# Patient Record
Sex: Male | Born: 2000 | Hispanic: No | Marital: Single | Smoking: Never smoker
Health system: Southern US, Community
[De-identification: ages and names within clinical notes are randomized; demographics above are authoritative.]

## PROBLEM LIST (undated history)

## (undated) HISTORY — PX: OTHER SURGICAL HISTORY: SHX169

---

## 2021-02-07 DIAGNOSIS — Z419 Encounter for procedure for purposes other than remedying health state, unspecified: Secondary | ICD-10-CM | POA: Diagnosis not present

## 2021-02-11 ENCOUNTER — Ambulatory Visit (HOSPITAL_COMMUNITY)
Admission: EM | Admit: 2021-02-11 | Discharge: 2021-02-11 | Disposition: A | Discharge status: Home / Self Care | Payer: Medicaid Other | Attending: Student | Admitting: Student

## 2021-02-11 ENCOUNTER — Encounter (HOSPITAL_COMMUNITY): Payer: Self-pay | Admitting: Emergency Medicine

## 2021-02-11 ENCOUNTER — Other Ambulatory Visit: Payer: Self-pay

## 2021-02-11 DIAGNOSIS — S0081XA Abrasion of other part of head, initial encounter: Secondary | ICD-10-CM | POA: Diagnosis not present

## 2021-02-11 DIAGNOSIS — Z23 Encounter for immunization: Secondary | ICD-10-CM | POA: Diagnosis not present

## 2021-02-11 MED ORDER — TETANUS-DIPHTH-ACELL PERTUSSIS 5-2.5-18.5 LF-MCG/0.5 IM SUSY
PREFILLED_SYRINGE | INTRAMUSCULAR | Status: AC
  Filled 2021-02-11: qty 0.5

## 2021-02-11 MED ORDER — TETANUS-DIPHTH-ACELL PERTUSSIS 5-2.5-18.5 LF-MCG/0.5 IM SUSY
0.5000 mL | PREFILLED_SYRINGE | Freq: Once | INTRAMUSCULAR | Status: AC
  Administered 2021-02-11: 0.5 mL via INTRAMUSCULAR
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
## 2021-02-11 NOTE — Discharge Instructions (Addendum)
-  Keep your face wound dry for 4 days. After that, wash with gentle soap and water 2x daily. -Wash your wound with gentle soap and water 1-2 times daily.  Let air dry or gently pat. Follow with over-the-counter antibiotic ointment and a bandaid to keep clean. -Seek additional immediate medical attention if new symptoms like redness or swelling surrounding the wounds, discharge from the wounds, increasing jaw pain, pain with chewing, new headaches, vision changes, dizziness.

## 2021-02-14 ENCOUNTER — Other Ambulatory Visit: Payer: Self-pay

## 2021-02-14 ENCOUNTER — Ambulatory Visit (HOSPITAL_COMMUNITY)
Admission: EM | Admit: 2021-02-14 | Discharge: 2021-02-14 | Disposition: A | Discharge status: Home / Self Care | Payer: Medicaid Other

## 2021-02-14 ENCOUNTER — Encounter (HOSPITAL_COMMUNITY): Payer: Self-pay | Admitting: Emergency Medicine

## 2021-02-14 DIAGNOSIS — S0181XD Laceration without foreign body of other part of head, subsequent encounter: Secondary | ICD-10-CM

## 2021-02-14 DIAGNOSIS — S0181XA Laceration without foreign body of other part of head, initial encounter: Secondary | ICD-10-CM
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
## 2021-02-14 NOTE — Discharge Instructions (Addendum)
Soak area on your chin 20 minutes twice a day to help glue come off.  Continue antibiotic ointment to wounds

## 2021-02-15 NOTE — ED Provider Notes (Signed)
[facility]    CSN: [id number] Arrival date & time: 02/14/21  1546      History   Chief Complaint Chief Complaint  Patient presents with   Follow-up   Wound Check    HPI Kerry Hoffman is a 20 y.o. male.   The history is provided by the patient. No language interpreter was used.  Wound Check The current episode started more than 2 days ago. The problem occurs constantly. The problem has not changed since onset.Nothing aggravates the symptoms.   History reviewed. No pertinent past medical history.  There are no problems to display for this patient.   History reviewed. No pertinent surgical history.  Pt here for recheck of abrasions.  Pt reports areas seem to be healing well.     Home Medications    Prior to Admission medications   Not on File    Family History Family History  Problem Relation Age of Onset   Healthy Mother    Healthy Father     Social History Social History   Tobacco Use   Smoking status: Never   Smokeless tobacco: Never  Vaping Use   Vaping Use: Never used  Substance Use Topics   Alcohol use: Never   Drug use: Never     Allergies   Patient has no known allergies.   Review of Systems Review of Systems  Skin:  Positive for wound.  All other systems reviewed and are negative.   Physical Exam Triage Vital Signs ED Triage Vitals [02/14/21 1625]  Enc Vitals Group     BP 130/84     Pulse Rate (!) 52     Resp 16     Temp 98.1 F (36.7 C)     Temp Source Oral     SpO2 100 %     Weight      Height      Head Circumference      Peak Flow      Pain Score 4     Pain Loc      Pain Edu?      Excl. in GC?    No data found.  Updated Vital Signs BP 130/84 (BP Location: Right Arm)   Pulse (!) 52   Temp 98.1 F (36.7 C) (Oral)   Resp 16   SpO2 100%   Visual Acuity Right Eye Distance:   Left Eye Distance:   Bilateral Distance:    Right Eye Near:   Left Eye Near:    Bilateral Near:     Physical  Exam Vitals reviewed.  Cardiovascular:     Rate and Rhythm: Normal rate.  Pulmonary:     Effort: Pulmonary effort is normal.  Musculoskeletal:        General: Normal range of motion.  Skin:    Findings: Erythema present.     Comments: Abrasions arms and legs  scabbed,  no sign of infection  laceration chin glue in place  no sign of infection   Neurological:     General: No focal deficit present.     Mental Status: He is alert.  Psychiatric:        Mood and Affect: Mood normal.     UC Treatments / Results  Labs (all labs ordered are listed, but only abnormal results are displayed) Labs Reviewed - No data to display  EKG   Radiology No results found.  Procedures Procedures (including critical care time)  Medications Ordered in UC Medications - No data  to display  Initial Impression / Assessment and Plan / UC Course  I have reviewed the triage vital signs and the nursing notes.  Pertinent labs & imaging results that were available during my care of the patient were reviewed by me and considered in my medical decision making (see chart for details).     Final Clinical Impressions(s) / UC Diagnoses   Final diagnoses:  Facial laceration, subsequent encounter     Discharge Instructions      Soak area on your chin 20 minutes twice a day to help glue come off.  Continue antibiotic ointment to wounds    ED Prescriptions   None    PDMP not reviewed this encounter.   Elson Areas, [place] 02/15/21 1645

## 2021-03-10 DIAGNOSIS — Z419 Encounter for procedure for purposes other than remedying health state, unspecified: Secondary | ICD-10-CM | POA: Diagnosis not present

## 2021-04-01 ENCOUNTER — Ambulatory Visit (INDEPENDENT_AMBULATORY_CARE_PROVIDER_SITE_OTHER): Payer: Medicaid Other | Admitting: Family Medicine

## 2021-04-01 ENCOUNTER — Other Ambulatory Visit: Payer: Self-pay

## 2021-04-01 ENCOUNTER — Encounter: Payer: Self-pay | Admitting: Family Medicine

## 2021-04-01 VITALS — BP 124/80 | HR 63 | Temp 97.5°F | Resp 16 | Ht 71.5 in | Wt 158.0 lb

## 2021-04-01 DIAGNOSIS — Z1159 Encounter for screening for other viral diseases: Secondary | ICD-10-CM

## 2021-04-01 DIAGNOSIS — Z0000 Encounter for general adult medical examination without abnormal findings: Secondary | ICD-10-CM

## 2021-04-01 NOTE — Progress Notes (Signed)
New Patient Office Visit  Subjective:  Patient ID: Kerry Hoffman, male    DOB: Feb 20, 2001  Age: 20 y.o. MRN: [id number]  CC:  Chief Complaint  Patient presents with   Establish Care   Eye Problem    HPI Kerry Hoffman presents for to establish care and for a physical exam. OV was aided by an interpretor.   No past medical history on file.  No past surgical history on file.  Family History  Problem Relation Age of Onset   Healthy Mother    Healthy Father     Social History   Socioeconomic History   Marital status: Single    Spouse name: Not on file   Number of children: Not on file   Years of education: Not on file   Highest education level: Not on file  Occupational History   Not on file  Tobacco Use   Smoking status: Never   Smokeless tobacco: Never  Vaping Use   Vaping Use: Never used  Substance and Sexual Activity   Alcohol use: Never   Drug use: Never   Sexual activity: Not on file  Other Topics Concern   Not on file  Social History Narrative   Not on file   Social Determinants of Health   Financial Resource Strain: Not on file  Food Insecurity: Not on file  Transportation Needs: Not on file  Physical Activity: Not on file  Stress: Not on file  Social Connections: Not on file  Intimate Partner Violence: Not on file    ROS Review of Systems  All other systems reviewed and are negative.  Objective:   Today's Vitals: BP 124/80   Pulse 63   Temp (!) 97.5 F (36.4 C)   Resp 16   Ht 5' 11.5" (1.816 m)   Wt 158 lb (71.7 kg)   SpO2 98%   BMI 21.73 kg/m   Physical Exam Vitals and nursing note reviewed.  Constitutional:      General: He is not in acute distress. HENT:     Head: Normocephalic and atraumatic.     Right Ear: Tympanic membrane, ear canal and external ear normal.     Left Ear: Tympanic membrane, ear canal and external ear normal.     Mouth/Throat:     Mouth: Mucous membranes are moist.     Pharynx: Oropharynx  is clear.  Eyes:     Conjunctiva/sclera: Conjunctivae normal.     Pupils: Pupils are equal, round, and reactive to light.  Cardiovascular:     Rate and Rhythm: Normal rate and regular rhythm.     Pulses: Normal pulses.     Heart sounds: Normal heart sounds.  Pulmonary:     Effort: Pulmonary effort is normal. No respiratory distress.     Breath sounds: Normal breath sounds.  Abdominal:     General: There is no distension.     Palpations: Abdomen is soft.     Tenderness: There is no abdominal tenderness.  Genitourinary:    Comments: Patient deferred Musculoskeletal:     Right lower leg: No edema.     Left lower leg: No edema.  Skin:    General: Skin is warm and dry.  Neurological:     General: No focal deficit present.     Mental Status: He is alert and oriented to person, place, and time. Mental status is at baseline.  Psychiatric:        Mood and Affect: Mood normal.  Behavior: Behavior normal.    Assessment & Plan:   1. Annual physical exam Unremarkable exam - routine labs ordered - CMP14+EGFR - CBC with Differential  2. Need for hepatitis C screening test  - Hepatitis C Antibody     Follow-up: Return in about 1 year (around 04/01/2022) for physical.   Becky Sax, MD

## 2021-04-01 NOTE — Progress Notes (Signed)
Physical

## 2021-04-02 LAB — CMP14+EGFR
ALT: 14 IU/L (ref 0–44)
AST: 24 IU/L (ref 0–40)
Albumin/Globulin Ratio: 1.8 (ref 1.2–2.2)
Albumin: 4.6 g/dL (ref 4.1–5.2)
Alkaline Phosphatase: 116 IU/L (ref 51–125)
BUN/Creatinine Ratio: 15 (ref 9–20)
BUN: 14 mg/dL (ref 6–20)
Bilirubin Total: 0.6 mg/dL (ref 0.0–1.2)
CO2: 22 mmol/L (ref 20–29)
Calcium: 9.6 mg/dL (ref 8.7–10.2)
Chloride: 101 mmol/L (ref 96–106)
Creatinine, Ser: 0.93 mg/dL (ref 0.76–1.27)
Globulin, Total: 2.6 g/dL (ref 1.5–4.5)
Glucose: 92 mg/dL (ref 65–99)
Potassium: 3.6 mmol/L (ref 3.5–5.2)
Sodium: 141 mmol/L (ref 134–144)
Total Protein: 7.2 g/dL (ref 6.0–8.5)
eGFR: 121 mL/min/{1.73_m2} (ref >59)

## 2021-04-02 LAB — CBC WITH DIFFERENTIAL/PLATELET
Basophils Absolute: 0 10*3/uL (ref 0.0–0.2)
Basos: 0 %
EOS (ABSOLUTE): 0.1 10*3/uL (ref 0.0–0.4)
Eos: 2 %
Hematocrit: 40.4 % (ref 37.5–51.0)
Hemoglobin: 14.2 g/dL (ref 13.0–17.7)
Immature Grans (Abs): 0 10*3/uL (ref 0.0–0.1)
Immature Granulocytes: 0 %
Lymphocytes Absolute: 2.4 10*3/uL (ref 0.7–3.1)
Lymphs: 41 %
MCH: 31.8 pg (ref 26.6–33.0)
MCHC: 35.1 g/dL (ref 31.5–35.7)
MCV: 90 fL (ref 79–97)
Monocytes Absolute: 0.6 10*3/uL (ref 0.1–0.9)
Monocytes: 10 %
Neutrophils Absolute: 2.8 10*3/uL (ref 1.4–7.0)
Neutrophils: 47 %
Platelets: 189 10*3/uL (ref 150–450)
RBC: 4.47 x10E6/uL (ref 4.14–5.80)
RDW: 12.5 % (ref 11.6–15.4)
WBC: 5.9 10*3/uL (ref 3.4–10.8)

## 2021-04-02 LAB — HEPATITIS C ANTIBODY: Hep C Virus Ab: <0.1 s/co ratio (ref 0.0–0.9)

## 2021-04-10 DIAGNOSIS — Z419 Encounter for procedure for purposes other than remedying health state, unspecified: Secondary | ICD-10-CM | POA: Diagnosis not present

## 2021-04-11 DIAGNOSIS — H5213 Myopia, bilateral: Secondary | ICD-10-CM | POA: Diagnosis not present

## 2021-04-21 ENCOUNTER — Telehealth: Payer: Self-pay | Admitting: Family Medicine

## 2021-04-21 NOTE — Telephone Encounter (Signed)
Patients mom came in the office asking to call the patient about the lab results from his 04/01/2021 appointment. Patient  has never received a call with the results.

## 2021-04-22 NOTE — Telephone Encounter (Signed)
Patient called and there was no answer or VM 

## 2021-04-24 ENCOUNTER — Other Ambulatory Visit: Payer: Self-pay | Admitting: *Deleted

## 2021-05-05 NOTE — Telephone Encounter (Signed)
Call place to patient regarding lab results and there was no answer on phone

## 2021-05-05 NOTE — Telephone Encounter (Signed)
Patient would like lab results from appointment, asking if the nurse could please call again.

## 2021-05-10 DIAGNOSIS — Z419 Encounter for procedure for purposes other than remedying health state, unspecified: Secondary | ICD-10-CM | POA: Diagnosis not present

## 2021-06-10 DIAGNOSIS — Z419 Encounter for procedure for purposes other than remedying health state, unspecified: Secondary | ICD-10-CM | POA: Diagnosis not present

## 2021-07-10 DIAGNOSIS — Z419 Encounter for procedure for purposes other than remedying health state, unspecified: Secondary | ICD-10-CM | POA: Diagnosis not present

## 2021-08-10 DIAGNOSIS — Z419 Encounter for procedure for purposes other than remedying health state, unspecified: Secondary | ICD-10-CM | POA: Diagnosis not present

## 2021-09-10 DIAGNOSIS — Z419 Encounter for procedure for purposes other than remedying health state, unspecified: Secondary | ICD-10-CM | POA: Diagnosis not present

## 2021-10-05 ENCOUNTER — Other Ambulatory Visit: Payer: Self-pay

## 2021-10-05 ENCOUNTER — Encounter (HOSPITAL_COMMUNITY): Payer: Self-pay

## 2021-10-05 ENCOUNTER — Ambulatory Visit (INDEPENDENT_AMBULATORY_CARE_PROVIDER_SITE_OTHER): Payer: Medicaid Other

## 2021-10-05 ENCOUNTER — Ambulatory Visit (HOSPITAL_COMMUNITY)
Admission: EM | Admit: 2021-10-05 | Discharge: 2021-10-05 | Disposition: A | Discharge status: Home / Self Care | Payer: Medicaid Other | Attending: Physician Assistant | Admitting: Physician Assistant

## 2021-10-05 DIAGNOSIS — M533 Sacrococcygeal disorders, not elsewhere classified: Secondary | ICD-10-CM

## 2021-10-05 MED ORDER — NAPROXEN 500 MG PO TABS: 500.0000 mg | ORAL_TABLET | Freq: Two times a day (BID) | ORAL | 0 refills | Status: DC

## 2021-10-05 NOTE — ED Provider Notes (Signed)
[facility]    CSN: [id number] Arrival date & time: 10/05/21  1638      History   Chief Complaint Chief Complaint  Patient presents with   Tailbone Pain    HPI Kerry Hoffman is a 21 y.o. male.   Patient presents today with a 3-day history of recurrence lower back pain.  Reports that he was wrestling when he developed pain and pain has been ongoing since that time.  He denies any fall or additional injury.  He does have a history of recurrent symptoms over the past several years but generally is only as per day at a time and resolve without intervention.  He reports pain is currently rated 6 on a 0-10 pain scale, localized to lower back/sacrum, described as aching, worse with changing positions, no relieving factors identified.  He has not tried any over-the-counter medication for symptom management.  Denies any previous spinal surgery.  Denies history of malignancy.  He denies any additional symptoms including rectal pain, urinary frequency, urinary urgency, fever, nausea, vomiting, fatigue, malaise.  He denies any lower extremity weakness, saddle anesthesia, bowel/bladder incontinence.   History reviewed. No pertinent past medical history.  There are no problems to display for this patient.   History reviewed. No pertinent surgical history.     Home Medications    Prior to Admission medications   Medication Sig Start Date End Date Taking? Authorizing Provider  naproxen (NAPROSYN) 500 MG tablet Take 1 tablet (500 mg total) by mouth 2 (two) times daily. 10/05/21  Yes Abdulkareem Badolato, Noberto Retort, PA-C    Family History Family History  Problem Relation Age of Onset   Healthy Mother    Healthy Father     Social History Social History   Tobacco Use   Smoking status: Never   Smokeless tobacco: Never  Vaping Use   Vaping Use: Never used  Substance Use Topics   Alcohol use: Never   Drug use: Never     Allergies   Patient has no known allergies.   Review of  Systems Review of Systems  Constitutional:  Positive for activity change. Negative for appetite change, fatigue and fever.  Respiratory:  Negative for cough and shortness of breath.   Cardiovascular:  Negative for chest pain.  Gastrointestinal:  Negative for abdominal pain, diarrhea, nausea and vomiting.  Musculoskeletal:  Positive for back pain. Negative for arthralgias and myalgias.  Neurological:  Negative for dizziness, weakness, light-headedness, numbness and headaches.    Physical Exam Triage Vital Signs ED Triage Vitals  Enc Vitals Group     BP 10/05/21 1734 131/89     Pulse Rate 10/05/21 1734 78     Resp 10/05/21 1732 16     Temp 10/05/21 1734 99.3 F (37.4 C)     Temp Source 10/05/21 1734 Oral     SpO2 10/05/21 1732 100 %     Weight --      Height --      Head Circumference --      Peak Flow --      Pain Score 10/05/21 1732 10     Pain Loc --      Pain Edu? --      Excl. in GC? --    No data found.  Updated Vital Signs BP 131/89 (BP Location: Left Arm)    Pulse 78    Temp 99.3 F (37.4 C) (Oral)    Resp 16    SpO2 100%   Visual Acuity  Right Eye Distance:   Left Eye Distance:   Bilateral Distance:    Right Eye Near:   Left Eye Near:    Bilateral Near:     Physical Exam Vitals reviewed.  Constitutional:      General: He is awake.     Appearance: Normal appearance. He is well-developed. He is not ill-appearing.     Comments: Very pleasant male appears stated age no acute distress sitting comfortably in exam room  HENT:     Head: Normocephalic and atraumatic.     Mouth/Throat:     Pharynx: No oropharyngeal exudate, posterior oropharyngeal erythema or uvula swelling.  Cardiovascular:     Rate and Rhythm: Normal rate and regular rhythm.     Heart sounds: Normal heart sounds, S1 normal and S2 normal. No murmur heard. Pulmonary:     Effort: Pulmonary effort is normal.     Breath sounds: Normal breath sounds. No stridor. No wheezing, rhonchi or rales.      Comments: Clear to auscultation bilaterally Abdominal:     General: Bowel sounds are normal.     Palpations: Abdomen is soft.     Tenderness: There is no abdominal tenderness.     Comments: Benign abdominal exam  Musculoskeletal:     Cervical back: No tenderness or bony tenderness.     Thoracic back: No tenderness or bony tenderness.     Lumbar back: Bony tenderness present. No tenderness. Negative right straight leg raise test and negative left straight leg raise test.     Comments: Tenderness palpation over lower lumbar vertebrae and sacrum.  Tenderness palpation of bilateral lower lumbar paraspinal muscles.  No deformity or step-off noted.  Strength 5/5 bilateral lower extremities.  Neurological:     Mental Status: He is alert.  Psychiatric:        Behavior: Behavior is cooperative.     UC Treatments / Results  Labs (all labs ordered are listed, but only abnormal results are displayed) Labs Reviewed - No data to display  EKG   Radiology DG Sacrum/Coccyx  Result Date: 10/05/2021 CLINICAL DATA:  Tailbone pain over the last week. EXAM: SACRUM AND COCCYX - 2+ VIEW COMPARISON:  None. FINDINGS: Normal radiography. No traumatic or other abnormal finding. Sacroiliac joints appear normal. IMPRESSION: Normal radiographs. Electronically Signed   By: Paulina Fusi M.D.   On: 10/05/2021 18:26    Procedures Procedures (including critical care time)  Medications Ordered in UC Medications - No data to display  Initial Impression / Assessment and Plan / UC Course  I have reviewed the triage vital signs and the nursing notes.  Pertinent labs & imaging results that were available during my care of the patient were reviewed by me and considered in my medical decision making (see chart for details).     Patient is well-appearing, nontoxic, afebrile, nontachycardic.  Suspect exacerbation of chronic pain related to increased physical activity.  X-ray was obtained given bony tenderness which  showed no osseous abnormality.  Patient was started on Naprosyn twice daily with instruction not to take additional NSAIDs with this medication due to risk of GI bleeding.  Encouraged him to use heat, rest, stretch for symptom relief.  Discussed that he should avoid sitting or standing 1 position as this can exacerbate pain.  Discussed that if symptoms or not improving he should follow-up with the sports medicine provider and was given contact information for local provider with instruction to call to schedule appointment if needed.  Strict return precautions  given to which patient expressed understanding.  Work excuse note provided.  Final Clinical Impressions(s) / UC Diagnoses   Final diagnoses:  Coccyxdynia     Discharge Instructions      Your x-ray was normal.  Please take Naprosyn twice daily to help with pain and inflammation.  You should not take NSAIDs with this medication including aspirin, ibuprofen/Advil, naproxen/Aleve as it can cause stomach bleeding.  You can use Tylenol for breakthrough pain.  Try to avoid prolonged sitting or standing in 1 position as this can worsen symptoms.  Use heat, rest, gentle stretch for symptom relief.  If anything worsens you have severe pain or develops systemic symptoms such as fever, nausea, vomiting, urinary symptoms you need to be seen immediately.  If symptoms or not improving please follow-up with sports medicine for further evaluation and management; call to schedule an appointment.     ED Prescriptions     Medication Sig Dispense Auth. Provider   naproxen (NAPROSYN) 500 MG tablet Take 1 tablet (500 mg total) by mouth 2 (two) times daily. 30 tablet Michale Weikel, Noberto Retort, PA-C      PDMP not reviewed this encounter.   Jeani Hawking, PA-C 10/05/21 1911

## 2021-10-05 NOTE — ED Triage Notes (Signed)
Pt presents to the office for tailbone pain x 1 week. He reports recurring back pain.

## 2021-10-05 NOTE — Discharge Instructions (Signed)
Your x-ray was normal.  Please take Naprosyn twice daily to help with pain and inflammation.  You should not take NSAIDs with this medication including aspirin, ibuprofen/Advil, naproxen/Aleve as it can cause stomach bleeding.  You can use Tylenol for breakthrough pain.  Try to avoid prolonged sitting or standing in 1 position as this can worsen symptoms.  Use heat, rest, gentle stretch for symptom relief.  If anything worsens you have severe pain or develops systemic symptoms such as fever, nausea, vomiting, urinary symptoms you need to be seen immediately.  If symptoms or not improving please follow-up with sports medicine for further evaluation and management; call to schedule an appointment.

## 2021-10-06 ENCOUNTER — Telehealth: Payer: Self-pay | Admitting: *Deleted

## 2021-10-06 NOTE — Telephone Encounter (Signed)
This CN placed call to check on client.  Client was seen in urgent care yesterday.  Client had previously asked this CN to obtain a doctor appointment for him.  However, client now says he will wait and see how the medication does for his back pain and then will let me know if a follow up appointment is needed.  Roderic Palau, RN, MSN, CNP [phone] Office [phone] Cell

## 2021-10-08 DIAGNOSIS — Z419 Encounter for procedure for purposes other than remedying health state, unspecified: Secondary | ICD-10-CM | POA: Diagnosis not present

## 2021-11-08 DIAGNOSIS — Z419 Encounter for procedure for purposes other than remedying health state, unspecified: Secondary | ICD-10-CM | POA: Diagnosis not present

## 2021-12-01 ENCOUNTER — Ambulatory Visit (INDEPENDENT_AMBULATORY_CARE_PROVIDER_SITE_OTHER): Payer: Medicaid Other | Admitting: Physician Assistant

## 2021-12-01 ENCOUNTER — Encounter: Payer: Self-pay | Admitting: Family Medicine

## 2021-12-01 ENCOUNTER — Encounter: Payer: Self-pay | Admitting: Physician Assistant

## 2021-12-01 VITALS — BP 113/64 | HR 56 | Temp 98.5°F | Resp 18 | Ht 71.0 in | Wt 159.0 lb

## 2021-12-01 DIAGNOSIS — M545 Low back pain, unspecified: Secondary | ICD-10-CM

## 2021-12-01 MED ORDER — NAPROXEN 500 MG PO TABS: 500.0000 mg | ORAL_TABLET | Freq: Two times a day (BID) | ORAL | 0 refills | Status: DC

## 2021-12-01 NOTE — Patient Instructions (Signed)
You can use the naproxen twice a day to help with your occasional back pain.  I encourage you to make sure you are staying very well-hydrated, you should drink at least 64 ounces of water a day, more on the days that you exercise. ? ?Please let us know if there is any else we can do for you ? ?Roney Jaffe, PA-C ?Physician Assistant ?[facility] ?[email or web address] ? ? ?Acute Back Pain, Adult ?Acute back pain is sudden and usually short-lived. It is often caused by an injury to the muscles and tissues in the back. The injury may result from: ?A muscle, tendon, or ligament getting overstretched or torn. Ligaments are tissues that connect bones to each other. Lifting something improperly can cause a back strain. ?Wear and tear (degeneration) of the spinal disks. Spinal disks are circular tissue that provide cushioning between the bones of the spine (vertebrae). ?Twisting motions, such as while playing sports or doing yard work. ?A hit to the back. ?Arthritis. ?You may have a physical exam, lab tests, and imaging tests to find the cause of your pain. Acute back pain usually goes away with rest and home care. ?Follow these instructions at home: ?Managing pain, stiffness, and swelling ?Take over-the-counter and prescription medicines only as told by your health care provider. Treatment may include medicines for pain and inflammation that are taken by mouth or applied to the skin, or muscle relaxants. ?Your health care provider may recommend applying ice during the first 24-48 hours after your pain starts. To do this: ?Put ice in a plastic bag. ?Place a towel between your skin and the bag. ?Leave the ice on for 20 minutes, 2-3 times a day. ?Remove the ice if your skin turns bright red. This is very important. If you cannot feel pain, heat, or cold, you have a greater risk of damage to the area. ?If directed, apply heat to the affected area as often as told by your  health care provider. Use the heat source that your health care provider recommends, such as a moist heat pack or a heating pad. ?Place a towel between your skin and the heat source. ?Leave the heat on for 20-30 minutes. ?Remove the heat if your skin turns bright red. This is especially important if you are unable to feel pain, heat, or cold. You have a greater risk of getting burned. ?Activity ? ?Do not stay in bed. Staying in bed for more than 1-2 days can delay your recovery. ?Sit up and stand up straight. Avoid leaning forward when you sit or hunching over when you stand. ?If you work at a desk, sit close to it so you do not need to lean over. Keep your chin tucked in. Keep your neck drawn back, and keep your elbows bent at a 90-degree angle (right angle). ?Sit high and close to the steering wheel when you drive. Add lower back (lumbar) support to your car seat, if needed. ?Take short walks on even surfaces as soon as you are able. Try to increase the length of time you walk each day. ?Do not sit, drive, or stand in one place for more than 30 minutes at a time. Sitting or standing for long periods of time can put stress on your back. ?Do not drive or use heavy machinery while taking prescription pain medicine. ?Use proper lifting techniques. When you bend and lift, use positions that put less stress on your back: ?[place] your knees. ?Keep the load close to your body. ?  Avoid twisting. ?Exercise regularly as told by your health care provider. Exercising helps your back heal faster and helps prevent back injuries by keeping muscles strong and flexible. ?Work with a physical therapist to make a safe exercise program, as recommended by your health care provider. Do any exercises as told by your physical therapist. ?Lifestyle ?Maintain a healthy weight. Extra weight puts stress on your back and makes it difficult to have good posture. ?Avoid activities or situations that make you feel anxious or stressed. Stress and  anxiety increase muscle tension and can make back pain worse. Learn ways to manage anxiety and stress, such as through exercise. ?General instructions ?Sleep on a firm mattress in a comfortable position. Try lying on your side with your knees slightly bent. If you lie on your back, put a pillow under your knees. ?Keep your head and neck in a straight line with your spine (neutral position) when using electronic equipment like smartphones or pads. To do this: ?Raise your smartphone or pad to look at it instead of bending your head or neck to look down. ?Put the smartphone or pad at the level of your face while looking at the screen. ?Follow your treatment plan as told by your health care provider. This may include: ?Cognitive or behavioral therapy. ?Acupuncture or massage therapy. ?Meditation or yoga. ?Contact a health care provider if: ?You have pain that is not relieved with rest or medicine. ?You have increasing pain going down into your legs or buttocks. ?Your pain does not improve after 2 weeks. ?You have pain at night. ?You lose weight without trying. ?You have a fever or chills. ?You develop nausea or vomiting. ?You develop abdominal pain. ?Get help right away if: ?You develop new bowel or bladder control problems. ?You have unusual weakness or numbness in your arms or legs. ?You feel faint. ?These symptoms may represent a serious problem that is an emergency. Do not wait to see if the symptoms will go away. Get medical help right away. Call your local emergency services (911 in the [place] Do not drive yourself to the hospital. ?Summary ?Acute back pain is sudden and usually short-lived. ?Use proper lifting techniques. When you bend and lift, use positions that put less stress on your back. ?Take over-the-counter and prescription medicines only as told by your health care provider, and apply heat or ice as told. ?This information is not intended to replace advice given to you by your health care provider. Make  sure you discuss any questions you have with your health care provider. ?Document Revised: 10/18/2020 Document Reviewed: 10/18/2020 ?Elsevier Patient Education ? 2023 Elsevier Inc. ? ?

## 2021-12-01 NOTE — Progress Notes (Signed)
Patient reports intermittent back pain over the past two weeks and reports he may have slept wrong. ? ?

## 2021-12-01 NOTE — Progress Notes (Signed)
? ?Established Patient Office Visit ? ?Subjective   ?Patient ID: Kerry Hoffman, male    DOB: 2001-08-03  Age: 21 y.o. MRN: [id number] ? ?Chief Complaint  ?Patient presents with  ? Headache  ? ? ?States that he does not get headaches very often, states that they occur a couple of times every 6 months, states that they are easily alleviated. ? ?Does request a refill of his naproxen.  States that he will occasionally have back pain, believes it is due to over exercising, denies any injury or trauma.  States when this pain occurs that he uses the naproxen with relief. ? ?States that sleep is good, enjoys daily exercise, water intake is good, feels he eats a healthy diet. ? ?No other concerns at this time.   ? ? ?History reviewed. No pertinent past medical history. ?Social History  ? ?Socioeconomic History  ? Marital status: Single  ?  Spouse name: Not on file  ? Number of children: Not on file  ? Years of education: Not on file  ? Highest education level: Not on file  ?Occupational History  ? Not on file  ?Tobacco Use  ? Smoking status: Never  ? Smokeless tobacco: Never  ?Vaping Use  ? Vaping Use: Never used  ?Substance and Sexual Activity  ? Alcohol use: Never  ? Drug use: Never  ? Sexual activity: Not on file  ?Other Topics Concern  ? Not on file  ?Social History Narrative  ? Not on file  ? ?Social Determinants of Health  ? ?Financial Resource Strain: Not on file  ?Food Insecurity: Not on file  ?Transportation Needs: Not on file  ?Physical Activity: Not on file  ?Stress: Not on file  ?Social Connections: Not on file  ?Intimate Partner Violence: Not on file  ? ?Family History  ?Problem Relation Age of Onset  ? Healthy Mother   ? Healthy Father   ? ?No Known Allergies ?  ? ?Review of Systems  ?Constitutional:  Negative for chills and fever.  ?HENT: Negative.    ?Eyes: Negative.   ?Respiratory:  Negative for shortness of breath.   ?Cardiovascular:  Negative for chest pain.  ?Gastrointestinal: Negative.    ?Genitourinary: Negative.   ?Musculoskeletal: Negative.   ?Skin: Negative.   ?Neurological: Negative.   ?Endo/Heme/Allergies: Negative.   ?Psychiatric/Behavioral: Negative.    ? ?  ?Objective:  ?  ? ?BP 113/64 (BP Location: Left Arm, Patient Position: Sitting, Cuff Size: Normal)   Pulse (!) 56   Temp 98.5 ?F (36.9 ?C) (Oral)   Resp 18   Ht 5\' 11"  (1.803 m)   Wt 159 lb (72.1 kg)   SpO2 98%   BMI 22.18 kg/m?  ? ? ?Physical Exam ?Vitals and nursing note reviewed.  ?Constitutional:   ?   Appearance: Normal appearance.  ?HENT:  ?   Head: Normocephalic and atraumatic.  ?   Right Ear: External ear normal.  ?   Left Ear: External ear normal.  ?   Nose: Nose normal.  ?   Mouth/Throat:  ?   Mouth: Mucous membranes are moist.  ?   Pharynx: Oropharynx is clear.  ?Eyes:  ?   Extraocular Movements: Extraocular movements intact.  ?   Conjunctiva/sclera: Conjunctivae normal.  ?   Pupils: Pupils are equal, round, and reactive to light.  ?Cardiovascular:  ?   Rate and Rhythm: Normal rate and regular rhythm.  ?   Pulses: Normal pulses.  ?   Heart sounds: Normal heart sounds.  ?  Pulmonary:  ?   Effort: Pulmonary effort is normal.  ?   Breath sounds: Normal breath sounds.  ?Musculoskeletal:     ?   General: Normal range of motion.  ?   Cervical back: Normal range of motion and neck supple.  ?Skin: ?   General: Skin is warm and dry.  ?Neurological:  ?   General: No focal deficit present.  ?   Mental Status: He is alert and oriented to person, place, and time.  ?Psychiatric:     ?   Mood and Affect: Mood normal.     ?   Behavior: Behavior normal.     ?   Thought Content: Thought content normal.     ?   Judgment: Judgment normal.  ? ? ? ?  ?Assessment & Plan:  ? ?Problem List Items Addressed This Visit   ?None ?Visit Diagnoses   ? ? Acute bilateral low back pain without sciatica    -  Primary  ? Relevant Medications  ? naproxen (NAPROSYN) 500 MG tablet  ? ?  ? ?1. Bilateral low back pain without sciatica, unspecified  chronicity ?Continue naproxen as needed.  Patient education given on supportive care.  Red flags given for prompt reevaluation. ?- naproxen (NAPROSYN) 500 MG tablet; Take 1 tablet (500 mg total) by mouth 2 (two) times daily.  Dispense: 30 tablet; Refill: 0 ? ? ? ?I have reviewed the patient's medical history (PMH, PSH, Social History, Family History, Medications, and allergies) , and have been updated if relevant. I spent 10 minutes reviewing chart and  face to face time with patient. ? ? ?Return if symptoms worsen or fail to improve.  ? ? ?Meika Earll S Mayers, PA-C ? ?

## 2021-12-02 ENCOUNTER — Telehealth: Payer: Self-pay | Admitting: *Deleted

## 2021-12-02 NOTE — Telephone Encounter (Signed)
Copied from CRM [id number]. Topic: Referral - Request for Referral ?>> Dec 02, 2021  1:49 PM McGill, Darlina Rumpf wrote: ?Has patient seen PCP for this complaint? No. Stated recently had an appointment and forgot to mention.  ?*If NO, is insurance requiring patient see PCP for this issue before PCP can refer them? N/A ?Referral for which specialty: Ophthalmologist  ?Preferred provider/office: N/A  ?Reason for referral: Genie Logue RN from [employer] stated pt forgot to mention headaches when wearing glasses. ?

## 2021-12-08 DIAGNOSIS — Z419 Encounter for procedure for purposes other than remedying health state, unspecified: Secondary | ICD-10-CM | POA: Diagnosis not present

## 2022-01-08 DIAGNOSIS — Z419 Encounter for procedure for purposes other than remedying health state, unspecified: Secondary | ICD-10-CM | POA: Diagnosis not present

## 2022-01-22 ENCOUNTER — Encounter (HOSPITAL_COMMUNITY): Payer: Self-pay | Admitting: Emergency Medicine

## 2022-01-22 ENCOUNTER — Ambulatory Visit (HOSPITAL_COMMUNITY)
Admission: EM | Admit: 2022-01-22 | Discharge: 2022-01-22 | Disposition: A | Discharge status: Home / Self Care | Payer: Medicaid Other | Attending: Internal Medicine | Admitting: Internal Medicine

## 2022-01-22 DIAGNOSIS — J029 Acute pharyngitis, unspecified: Secondary | ICD-10-CM | POA: Diagnosis not present

## 2022-01-22 DIAGNOSIS — Z20822 Contact with and (suspected) exposure to covid-19: Secondary | ICD-10-CM | POA: Insufficient documentation

## 2022-01-22 MED ORDER — KETOROLAC TROMETHAMINE 30 MG/ML IJ SOLN
INTRAMUSCULAR | Status: AC
  Filled 2022-01-22: qty 1

## 2022-01-22 MED ORDER — KETOROLAC TROMETHAMINE 30 MG/ML IJ SOLN
30.0000 mg | Freq: Once | INTRAMUSCULAR | Status: AC
  Administered 2022-01-22: 30 mg via INTRAMUSCULAR

## 2022-01-22 MED ORDER — IBUPROFEN 800 MG PO TABS: 800.0000 mg | ORAL_TABLET | Freq: Three times a day (TID) | ORAL | 0 refills | Status: DC | PRN

## 2022-01-22 NOTE — ED Triage Notes (Signed)
Pt reports headache and body aches x 3 days. Reports pain goes away for short time with ibuprofen but then comes back

## 2022-01-22 NOTE — Discharge Instructions (Addendum)
Please increase oral fluid intake Take ibuprofen as needed for pain We will call you with recommendations if labs are abnormal Please quarantine until COVID-19 test results are available Return to urgent care if you have any other concerns.

## 2022-01-22 NOTE — ED Provider Notes (Signed)
[facility]    CSN: [id number] Arrival date & time: 01/22/22  1314      History   Chief Complaint Chief Complaint  Patient presents with   Headache   Generalized Body Aches    HPI Kerry Hoffman is a 21 y.o. male comes to the urgent care with a 3-day history of sore throat, generalized body aches, subjective fever, chills and left-sided headache.  Symptoms started insidiously and has been persistent.  Patient denies any light sensitivity.  No aggravation of headache by noise or bright light.  No nausea or vomiting.  No history of migraines.  Patient denies any sick contacts.  He is vaccinated against COVID-19 virus.  He contracted COVID-19 infection about 3 years ago.  Patient works as a Electrical engineer.  Patient endorses pain with swallowing and loss of appetite. HPI  History reviewed. No pertinent past medical history.  There are no problems to display for this patient.   History reviewed. No pertinent surgical history.     Home Medications    Prior to Admission medications   Medication Sig Start Date End Date Taking? Authorizing Provider  ibuprofen (ADVIL) 800 MG tablet Take 1 tablet (800 mg total) by mouth every 8 (eight) hours as needed. 01/22/22  Yes Lin Hackmann, Britta Mccreedy, MD    Family History Family History  Problem Relation Age of Onset   Healthy Mother    Healthy Father     Social History Social History   Tobacco Use   Smoking status: Never   Smokeless tobacco: Never  Vaping Use   Vaping Use: Never used  Substance Use Topics   Alcohol use: Never   Drug use: Never     Allergies   Patient has no known allergies.   Review of Systems Review of Systems  Constitutional:  Positive for chills, fatigue and fever. Negative for unexpected weight change.  HENT:  Positive for sore throat. Negative for congestion, ear discharge, ear pain, facial swelling, trouble swallowing and voice change.   Respiratory:  Negative for cough, shortness of  breath and wheezing.   Cardiovascular: Negative.   Gastrointestinal: Negative.      Physical Exam Triage Vital Signs ED Triage Vitals  Enc Vitals Group     BP 01/22/22 1405 (!) 131/96     Pulse Rate 01/22/22 1405 60     Resp --      Temp 01/22/22 1405 99.4 F (37.4 C)     Temp Source 01/22/22 1405 Oral     SpO2 01/22/22 1405 97 %     Weight --      Height --      Head Circumference --      Peak Flow --      Pain Score 01/22/22 1404 6     Pain Loc --      Pain Edu? --      Excl. in GC? --    No data found.  Updated Vital Signs BP (!) 131/96 (BP Location: Right Arm)   Pulse 60   Temp 99.4 F (37.4 C) (Oral)   SpO2 97%   Visual Acuity Right Eye Distance:   Left Eye Distance:   Bilateral Distance:    Right Eye Near:   Left Eye Near:    Bilateral Near:     Physical Exam Vitals and nursing note reviewed.  Constitutional:      General: He is not in acute distress.    Appearance: He is well-developed. He is not  ill-appearing.  Eyes:     Extraocular Movements: Extraocular movements intact.     Pupils: Pupils are equal, round, and reactive to light.  Cardiovascular:     Rate and Rhythm: Normal rate and regular rhythm.     Heart sounds: Normal heart sounds.  Pulmonary:     Effort: Pulmonary effort is normal.     Breath sounds: Normal breath sounds.  Abdominal:     General: Bowel sounds are normal.     Palpations: Abdomen is soft.  Neurological:     Mental Status: He is alert.     GCS: GCS eye subscore is 4. GCS verbal subscore is 5. GCS motor subscore is 6.      UC Treatments / Results  Labs (all labs ordered are listed, but only abnormal results are displayed) Labs Reviewed  SARS CORONAVIRUS 2 (TAT 6-24 HRS)    EKG   Radiology No results found.  Procedures Procedures (including critical care time)  Medications Ordered in UC Medications  ketorolac (TORADOL) 30 MG/ML injection 30 mg (has no administration in time range)    Initial  Impression / Assessment and Plan / UC Course  I have reviewed the triage vital signs and the nursing notes.  Pertinent labs & imaging results that were available during my care of the patient were reviewed by me and considered in my medical decision making (see chart for details).     1.  Acute viral pharyngitis: COVID-19 PCR test has been sent Toradol 30 mg IM x1 dose Ibuprofen 800 mg every 6-8 hours as needed for pain We will call patient with recommendations if labs are abnormal Quarantine precautions given Return precautions given Maintain adequate hydration  Final Clinical Impressions(s) / UC Diagnoses   Final diagnoses:  Acute viral pharyngitis     Discharge Instructions      Please increase oral fluid intake Take ibuprofen as needed for pain We will call you with recommendations if labs are abnormal Please quarantine until COVID-19 test results are available Return to urgent care if you have any other concerns.   ED Prescriptions     Medication Sig Dispense Auth. Provider   ibuprofen (ADVIL) 800 MG tablet Take 1 tablet (800 mg total) by mouth every 8 (eight) hours as needed. 21 tablet Deo Mehringer, Britta Mccreedy, MD      PDMP not reviewed this encounter.   Merrilee Jansky, MD 01/22/22 1432

## 2022-01-23 LAB — SARS CORONAVIRUS 2 (TAT 6-24 HRS): SARS Coronavirus 2: NEGATIVE

## 2022-02-07 DIAGNOSIS — Z419 Encounter for procedure for purposes other than remedying health state, unspecified: Secondary | ICD-10-CM | POA: Diagnosis not present

## 2022-02-08 DIAGNOSIS — M25511 Pain in right shoulder: Secondary | ICD-10-CM | POA: Diagnosis not present

## 2022-02-08 DIAGNOSIS — M549 Dorsalgia, unspecified: Secondary | ICD-10-CM | POA: Diagnosis not present

## 2022-02-08 DIAGNOSIS — S161XXA Strain of muscle, fascia and tendon at neck level, initial encounter: Secondary | ICD-10-CM | POA: Diagnosis not present

## 2022-02-08 DIAGNOSIS — M546 Pain in thoracic spine: Secondary | ICD-10-CM | POA: Diagnosis not present

## 2022-02-11 ENCOUNTER — Encounter: Payer: Self-pay | Admitting: *Deleted

## 2022-02-11 NOTE — Congregational Nurse Program (Signed)
  Dept: [phone]   Congregational Nurse Program Note  Date of Encounter: 02/11/2022  Past Medical History: No past medical history on file.  Encounter Details:  CNP Questionnaire - 02/11/22 1400       Questionnaire   Do you give verbal consent to treat you today? Yes    Location Patient [place]    Visit Setting Home;Phone/Text/Email    Patient Status Refugee    Insurance [employer] Referral N/A    Medication N/A    Medical Provider Yes    Screening Referrals N/A    Medical Referral N/A    Medical Appointment Made N/A    Food N/A    Transportation N/A    Housing/Utilities N/A    Interpersonal Safety N/A    Intervention Merchandiser, retail    ED Visit Averted Yes    Life-Saving Intervention Made N/A            Met with client in his home with his family.  Call placed to [employer] medicaid per client request.  Client has not received his new [facility] card.  Also updated PCP.  Card to be mailed with 10-14 days per [employer] insurance.  Explained all information to client.  Will follow up with client as requested.  Karene Fry, RN, MSN, [place] Office [phone] Cell

## 2022-03-10 ENCOUNTER — Telehealth: Payer: Self-pay | Admitting: Family Medicine

## 2022-03-10 DIAGNOSIS — Z419 Encounter for procedure for purposes other than remedying health state, unspecified: Secondary | ICD-10-CM | POA: Diagnosis not present

## 2022-03-10 NOTE — Telephone Encounter (Signed)
Copied from CRM [id number]. Topic: Appointment Scheduling - Scheduling Inquiry for Clinic >> Mar 10, 2022  3:10 PM [name] wrote: Genie with the Congregational Nurse Program called and said the patient was contacted and told they needed a vaccine before starting school on August 28th. Please assist in finding out which vaccine is needed and scheduling a visit to receive the vaccine.

## 2022-03-11 NOTE — Telephone Encounter (Signed)
Patient has been schedule for  nurse visit 

## 2022-03-20 ENCOUNTER — Ambulatory Visit (INDEPENDENT_AMBULATORY_CARE_PROVIDER_SITE_OTHER): Payer: Medicaid Other

## 2022-03-20 DIAGNOSIS — Z23 Encounter for immunization: Secondary | ICD-10-CM

## 2022-03-20 NOTE — Progress Notes (Signed)
Patient came in for Immunizations catch up Patient tolerated vaccine well. 

## 2022-04-10 DIAGNOSIS — Z419 Encounter for procedure for purposes other than remedying health state, unspecified: Secondary | ICD-10-CM | POA: Diagnosis not present

## 2022-04-22 ENCOUNTER — Telehealth: Payer: Self-pay | Admitting: Family Medicine

## 2022-04-22 NOTE — Telephone Encounter (Signed)
Pt called in from school w/ father Gul in the office -- states School is requesting Physical and IMMUNIZATION records for [facility] on [address] [place], [place] [address] [phone]   Pt's father was informed request is sent to Pt's nurse and due to Clinic time -they'll receive a call when this is ready for pick up.

## 2022-04-23 NOTE — Telephone Encounter (Signed)
Patient father was called to come pick up paper from office between 8-11 and 1-4 Mon-Fri

## 2022-05-10 DIAGNOSIS — Z419 Encounter for procedure for purposes other than remedying health state, unspecified: Secondary | ICD-10-CM | POA: Diagnosis not present

## 2022-05-22 ENCOUNTER — Telehealth: Payer: Self-pay | Admitting: Family Medicine

## 2022-05-22 NOTE — Telephone Encounter (Signed)
Father dropped off [employer] form for completion and asked to be notified when ready for pick up please. Placed in provider bin.  

## 2022-05-25 NOTE — Telephone Encounter (Signed)
Father was called to co me pick up paper for child. [employer] form and vaccine record  Aided by Simin [id number]  

## 2022-05-26 ENCOUNTER — Ambulatory Visit (INDEPENDENT_AMBULATORY_CARE_PROVIDER_SITE_OTHER): Payer: Medicaid Other

## 2022-05-26 ENCOUNTER — Telehealth: Payer: Self-pay | Admitting: Family Medicine

## 2022-05-26 ENCOUNTER — Encounter: Payer: Self-pay | Admitting: Family Medicine

## 2022-05-26 DIAGNOSIS — Z23 Encounter for immunization: Secondary | ICD-10-CM | POA: Diagnosis not present

## 2022-05-26 NOTE — Progress Notes (Signed)
Patient came  to get vaccine  that are need for school. Patient had no other concerns  today

## 2022-05-26 NOTE — Telephone Encounter (Signed)
Done

## 2022-05-26 NOTE — Telephone Encounter (Signed)
Pt states School mentioned "Until you get these vaccines, you cannot return to school." Pt adds, " They mark me absent everyday I don't have this."  Pt brought in [place] DEPT [employer] STATE Vaccination Doc Worksheet  Paperwork in provider bin.

## 2022-06-10 DIAGNOSIS — Z419 Encounter for procedure for purposes other than remedying health state, unspecified: Secondary | ICD-10-CM | POA: Diagnosis not present

## 2022-07-01 ENCOUNTER — Emergency Department
Admission: EM | Admit: 2022-07-01 | Discharge: 2022-07-01 | Disposition: A | Discharge status: Home / Self Care | Payer: Medicaid Other | Attending: Emergency Medicine | Admitting: Emergency Medicine

## 2022-07-01 ENCOUNTER — Other Ambulatory Visit: Payer: Self-pay

## 2022-07-01 ENCOUNTER — Emergency Department: Payer: Medicaid Other

## 2022-07-01 DIAGNOSIS — S5002XA Contusion of left elbow, initial encounter: Secondary | ICD-10-CM | POA: Diagnosis not present

## 2022-07-01 DIAGNOSIS — Y92410 Unspecified street and highway as the place of occurrence of the external cause: Secondary | ICD-10-CM | POA: Diagnosis not present

## 2022-07-01 DIAGNOSIS — S59902A Unspecified injury of left elbow, initial encounter: Secondary | ICD-10-CM | POA: Diagnosis present

## 2022-07-01 DIAGNOSIS — S5012XA Contusion of left forearm, initial encounter: Secondary | ICD-10-CM | POA: Diagnosis not present

## 2022-07-01 DIAGNOSIS — Z789 Other specified health status: Secondary | ICD-10-CM | POA: Diagnosis not present

## 2022-07-01 DIAGNOSIS — T1490XA Injury, unspecified, initial encounter: Secondary | ICD-10-CM | POA: Diagnosis not present

## 2022-07-01 DIAGNOSIS — R0689 Other abnormalities of breathing: Secondary | ICD-10-CM | POA: Diagnosis not present

## 2022-07-01 MED ORDER — IBUPROFEN 600 MG PO TABS: 600.0000 mg | ORAL_TABLET | Freq: Three times a day (TID) | ORAL | 0 refills | Status: DC | PRN

## 2022-07-01 NOTE — Discharge Instructions (Signed)
Follow-up with your primary care provider if any continued problems or concerns.  Use ice to your elbow as needed for discomfort and elevate if any swelling.  The Ace wrap was applied to provide some cushioning and protection.  A prescription for ibuprofen was sent to the pharmacy to be taken every 8 hours with food if needed for pain.  You may take Tylenol additionally if extra pain medication as needed.
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
## 2022-07-10 DIAGNOSIS — Z419 Encounter for procedure for purposes other than remedying health state, unspecified: Secondary | ICD-10-CM | POA: Diagnosis not present

## 2022-08-10 DIAGNOSIS — Z419 Encounter for procedure for purposes other than remedying health state, unspecified: Secondary | ICD-10-CM | POA: Diagnosis not present

## 2022-09-10 DIAGNOSIS — H5213 Myopia, bilateral: Secondary | ICD-10-CM | POA: Diagnosis not present

## 2022-09-10 DIAGNOSIS — Z419 Encounter for procedure for purposes other than remedying health state, unspecified: Secondary | ICD-10-CM | POA: Diagnosis not present

## 2022-10-09 DIAGNOSIS — Z419 Encounter for procedure for purposes other than remedying health state, unspecified: Secondary | ICD-10-CM | POA: Diagnosis not present

## 2022-10-18 ENCOUNTER — Ambulatory Visit (HOSPITAL_COMMUNITY)
Admission: EM | Admit: 2022-10-18 | Discharge: 2022-10-18 | Discharge status: Against Medical Advice | Payer: Medicaid Other

## 2022-10-18 ENCOUNTER — Other Ambulatory Visit (HOSPITAL_COMMUNITY): Payer: Medicaid Other

## 2022-10-18 NOTE — ED Triage Notes (Signed)
Attempted to get pt for triage no answer.

## 2022-10-18 NOTE — ED Notes (Signed)
Called 2x. No response.

## 2022-10-20 ENCOUNTER — Ambulatory Visit (HOSPITAL_COMMUNITY)
Admission: EM | Admit: 2022-10-20 | Discharge: 2022-10-20 | Disposition: A | Discharge status: Home / Self Care | Payer: Medicaid Other | Attending: Family Medicine | Admitting: Family Medicine

## 2022-10-20 ENCOUNTER — Ambulatory Visit (INDEPENDENT_AMBULATORY_CARE_PROVIDER_SITE_OTHER): Payer: Medicaid Other

## 2022-10-20 ENCOUNTER — Other Ambulatory Visit: Payer: Self-pay

## 2022-10-20 ENCOUNTER — Encounter (HOSPITAL_COMMUNITY): Payer: Self-pay

## 2022-10-20 DIAGNOSIS — M79645 Pain in left finger(s): Secondary | ICD-10-CM
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
## 2022-10-20 NOTE — Discharge Instructions (Addendum)
Advised patient of left thumb x-ray results with hardcopy provided to patient.  Advised patient may RICE affected area of left thumb for 30 minutes 3 times daily for the next 3 days.  Advised if symptoms worsen and/or unresolved please follow-up with [facility] orthopedic provider for further evaluation (contact information provided below).
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
## 2022-11-09 DIAGNOSIS — Z419 Encounter for procedure for purposes other than remedying health state, unspecified: Secondary | ICD-10-CM | POA: Diagnosis not present

## 2022-12-21 DIAGNOSIS — H52223 Regular astigmatism, bilateral: Secondary | ICD-10-CM | POA: Diagnosis not present

## 2023-02-01 ENCOUNTER — Encounter: Payer: Self-pay | Admitting: Family Medicine

## 2023-02-16 ENCOUNTER — Encounter: Payer: Self-pay | Admitting: Family Medicine

## 2023-02-16 ENCOUNTER — Ambulatory Visit: Payer: Medicaid Other | Admitting: Family Medicine

## 2023-02-16 VITALS — BP 108/70 | HR 64 | Temp 98.1°F | Resp 16 | Ht 71.0 in | Wt 171.4 lb

## 2023-02-16 DIAGNOSIS — Z1159 Encounter for screening for other viral diseases: Secondary | ICD-10-CM

## 2023-02-16 DIAGNOSIS — Z130 Encounter for screening for diseases of the blood and blood-forming organs and certain disorders involving the immune mechanism: Secondary | ICD-10-CM

## 2023-02-16 DIAGNOSIS — Z13220 Encounter for screening for lipoid disorders: Secondary | ICD-10-CM

## 2023-02-16 DIAGNOSIS — Z0000 Encounter for general adult medical examination without abnormal findings: Secondary | ICD-10-CM

## 2023-02-16 DIAGNOSIS — Z114 Encounter for screening for human immunodeficiency virus [HIV]: Secondary | ICD-10-CM

## 2023-02-17 ENCOUNTER — Encounter (HOSPITAL_COMMUNITY): Payer: Self-pay

## 2023-02-17 ENCOUNTER — Encounter: Payer: Self-pay | Admitting: Family Medicine

## 2023-02-17 LAB — LIPID PANEL
Chol/HDL Ratio: 2.6 ratio (ref 0.0–5.0)
Cholesterol, Total: 142 mg/dL (ref 100–199)
HDL: 55 mg/dL (ref >39)
LDL Chol Calc (NIH): 56 mg/dL (ref 0–99)
Triglycerides: 192 mg/dL — ABNORMAL HIGH (ref 0–149)
VLDL Cholesterol Cal: 31 mg/dL (ref 5–40)

## 2023-02-17 LAB — CMP14+EGFR
ALT: 11 IU/L (ref 0–44)
AST: 17 IU/L (ref 0–40)
Albumin: 4.5 g/dL (ref 4.3–5.2)
Alkaline Phosphatase: 100 IU/L (ref 44–121)
BUN/Creatinine Ratio: 19 (ref 9–20)
BUN: 16 mg/dL (ref 6–20)
Bilirubin Total: 0.4 mg/dL (ref 0.0–1.2)
CO2: 21 mmol/L (ref 20–29)
Calcium: 9.8 mg/dL (ref 8.7–10.2)
Chloride: 103 mmol/L (ref 96–106)
Creatinine, Ser: 0.84 mg/dL (ref 0.76–1.27)
Globulin, Total: 3 g/dL (ref 1.5–4.5)
Glucose: 102 mg/dL — ABNORMAL HIGH (ref 70–99)
Potassium: 4.2 mmol/L (ref 3.5–5.2)
Sodium: 140 mmol/L (ref 134–144)
Total Protein: 7.5 g/dL (ref 6.0–8.5)
eGFR: 127 mL/min/{1.73_m2} (ref >59)

## 2023-02-17 LAB — CBC WITH DIFFERENTIAL/PLATELET
Basophils Absolute: 0 10*3/uL (ref 0.0–0.2)
Basos: 0 %
EOS (ABSOLUTE): 0.1 10*3/uL (ref 0.0–0.4)
Eos: 2 %
Hematocrit: 44.9 % (ref 37.5–51.0)
Hemoglobin: 15.1 g/dL (ref 13.0–17.7)
Immature Grans (Abs): 0 10*3/uL (ref 0.0–0.1)
Immature Granulocytes: 0 %
Lymphocytes Absolute: 1.9 10*3/uL (ref 0.7–3.1)
Lymphs: 38 %
MCH: 30.5 pg (ref 26.6–33.0)
MCHC: 33.6 g/dL (ref 31.5–35.7)
MCV: 91 fL (ref 79–97)
Monocytes Absolute: 0.5 10*3/uL (ref 0.1–0.9)
Monocytes: 10 %
Neutrophils Absolute: 2.4 10*3/uL (ref 1.4–7.0)
Neutrophils: 50 %
Platelets: 131 10*3/uL — ABNORMAL LOW (ref 150–450)
RBC: 4.95 x10E6/uL (ref 4.14–5.80)
RDW: 12.3 % (ref 11.6–15.4)
WBC: 4.9 10*3/uL (ref 3.4–10.8)

## 2023-02-17 LAB — HIV ANTIBODY (ROUTINE TESTING W REFLEX): HIV Screen 4th Generation wRfx: NONREACTIVE

## 2023-02-17 LAB — HEPATITIS C ANTIBODY: Hep C Virus Ab: NONREACTIVE

## 2023-02-17 NOTE — Progress Notes (Signed)
Established Patient Office Visit  Subjective    Patient ID: Kerry Hoffman, male    DOB: 03-24-2001  Age: 22 y.o. MRN: [id number]  CC:  Chief Complaint  Patient presents with   Establish Care    HPI [facility] Kerry Hoffman presents for routine annual exam. Patient denies acute complaints.    Outpatient Encounter Medications as of 02/16/2023  Medication Sig   ondansetron (ZOFRAN) 4 MG tablet Take 4 mg by mouth daily as needed.   No facility-administered encounter medications on file as of 02/16/2023.    No past medical history on file.  No past surgical history on file.  Family History  Problem Relation Age of Onset   Healthy Mother    Healthy Father     Social History   Socioeconomic History   Marital status: Single    Spouse name: Not on file   Number of children: Not on file   Years of education: Not on file   Highest education level: Not on file  Occupational History   Not on file  Tobacco Use   Smoking status: Never   Smokeless tobacco: Never  Vaping Use   Vaping Use: Never used  Substance and Sexual Activity   Alcohol use: Not Currently   Drug use: Not Currently   Sexual activity: Not Currently  Other Topics Concern   Not on file  Social History Narrative   ** Merged History Encounter **       Social Determinants of Health   Financial Resource Strain: Not on file  Food Insecurity: Not on file  Transportation Needs: Not on file  Physical Activity: Not on file  Stress: Not on file  Social Connections: Not on file  Intimate Partner Violence: Not on file    Review of Systems  All other systems reviewed and are negative.       Objective    BP 108/70   Pulse 64   Temp 98.1 F (36.7 C) (Oral)   Resp 16   Ht 5\' 11"  (1.803 m)   Wt 171 lb 6.4 oz (77.7 kg)   SpO2 97%   BMI 23.91 kg/m   Physical Exam Vitals and nursing note reviewed.  Constitutional:      General: He is not in acute distress. HENT:     Head: Normocephalic and  atraumatic.     Right Ear: Tympanic membrane, ear canal and external ear normal.     Left Ear: Tympanic membrane, ear canal and external ear normal.     Mouth/Throat:     Mouth: Mucous membranes are moist.     Pharynx: Oropharynx is clear.  Eyes:     Conjunctiva/sclera: Conjunctivae normal.     Pupils: Pupils are equal, round, and reactive to light.  Cardiovascular:     Rate and Rhythm: Normal rate and regular rhythm.     Pulses: Normal pulses.     Heart sounds: Normal heart sounds.  Pulmonary:     Effort: Pulmonary effort is normal. No respiratory distress.     Breath sounds: Normal breath sounds.  Abdominal:     General: There is no distension.     Palpations: Abdomen is soft.     Tenderness: There is no abdominal tenderness.  Genitourinary:    Comments: Patient deferred Musculoskeletal:     Right lower leg: No edema.     Left lower leg: No edema.  Skin:    General: Skin is warm and dry.  Neurological:  General: No focal deficit present.     Mental Status: He is alert and oriented to person, place, and time. Mental status is at baseline.  Psychiatric:        Mood and Affect: Mood normal.        Behavior: Behavior normal.         Assessment & Plan:   1. Annual physical exam  - CMP14+EGFR  2. Screening for HIV (human immunodeficiency virus)  - HIV antibody (with reflex)  3. Need for hepatitis C screening test  - Hepatitis C Antibody  4. Screening for lipid disorders  - Lipid Panel  5. Screening for deficiency anemia  - CBC with Differential    No follow-ups on file.   Tommie Raymond, MD

## 2023-03-19 IMAGING — DX DG SACRUM/COCCYX 2+V
3 series · 3 of 3 positions shown · non-contrast
Comparison: None.

CLINICAL DATA: Tailbone pain over the last week.

EXAM:
SACRUM AND COCCYX - 2+ VIEW

[coccyx ap]
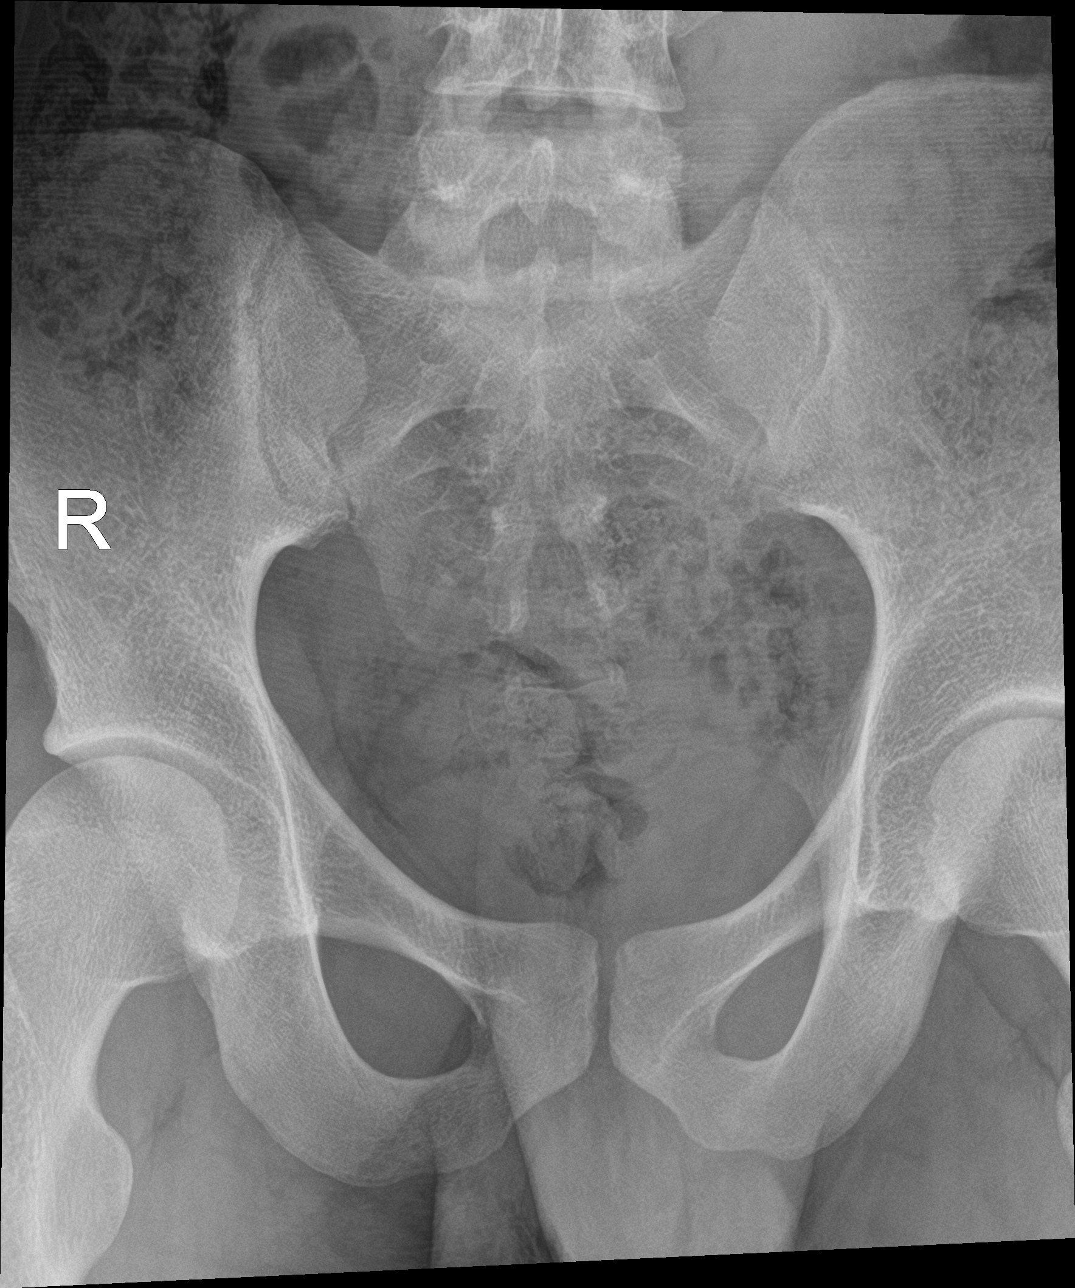

[sacrum ap]
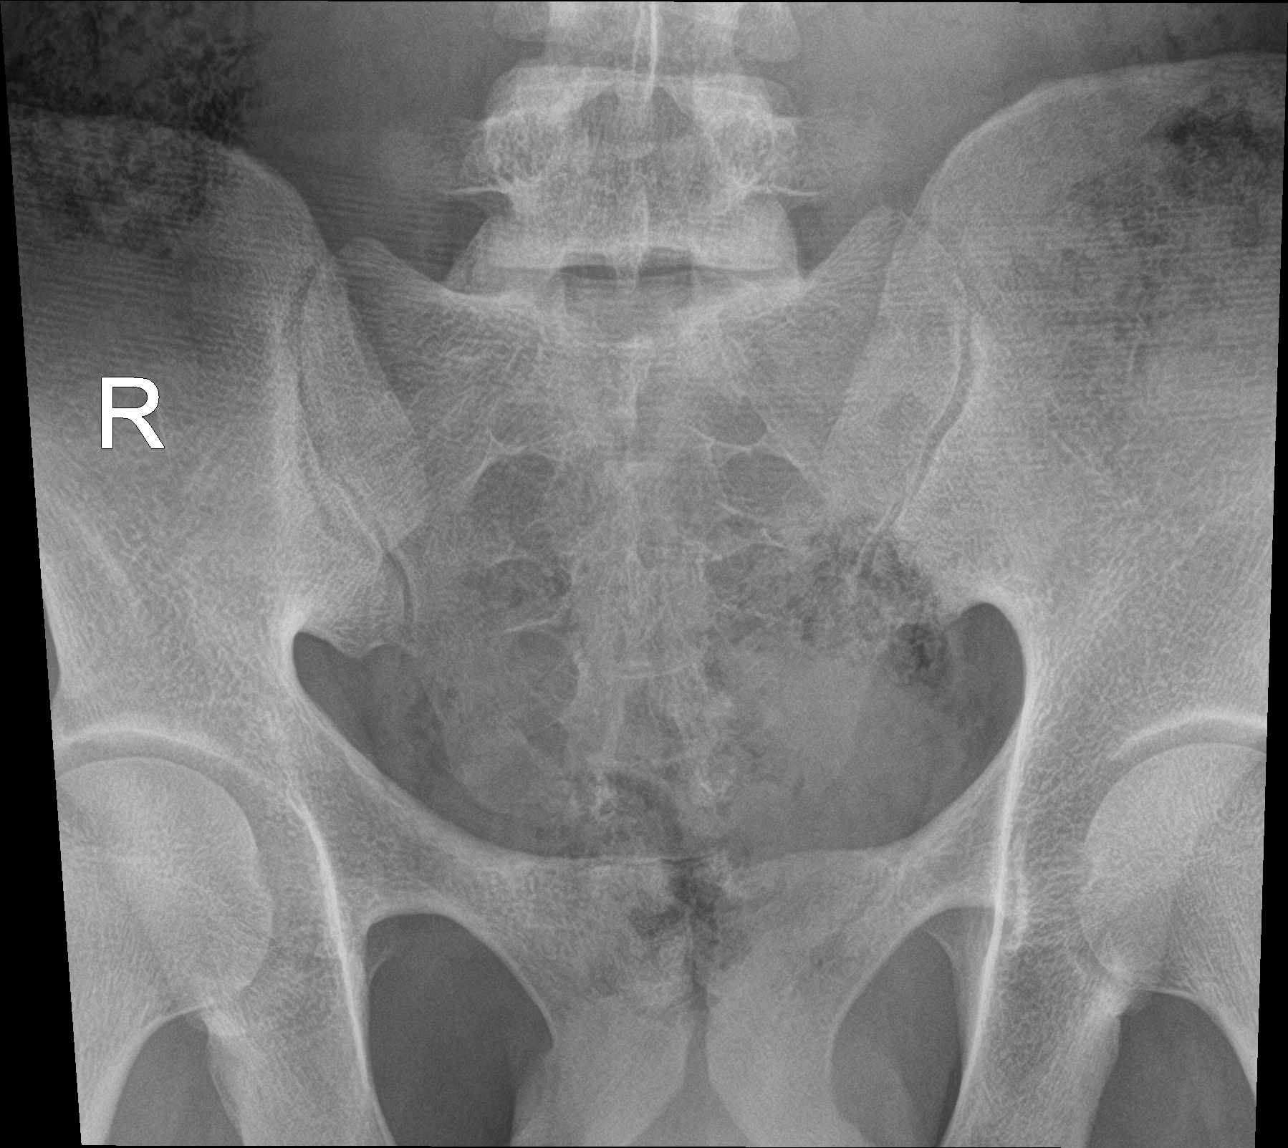

[sacrum lat]
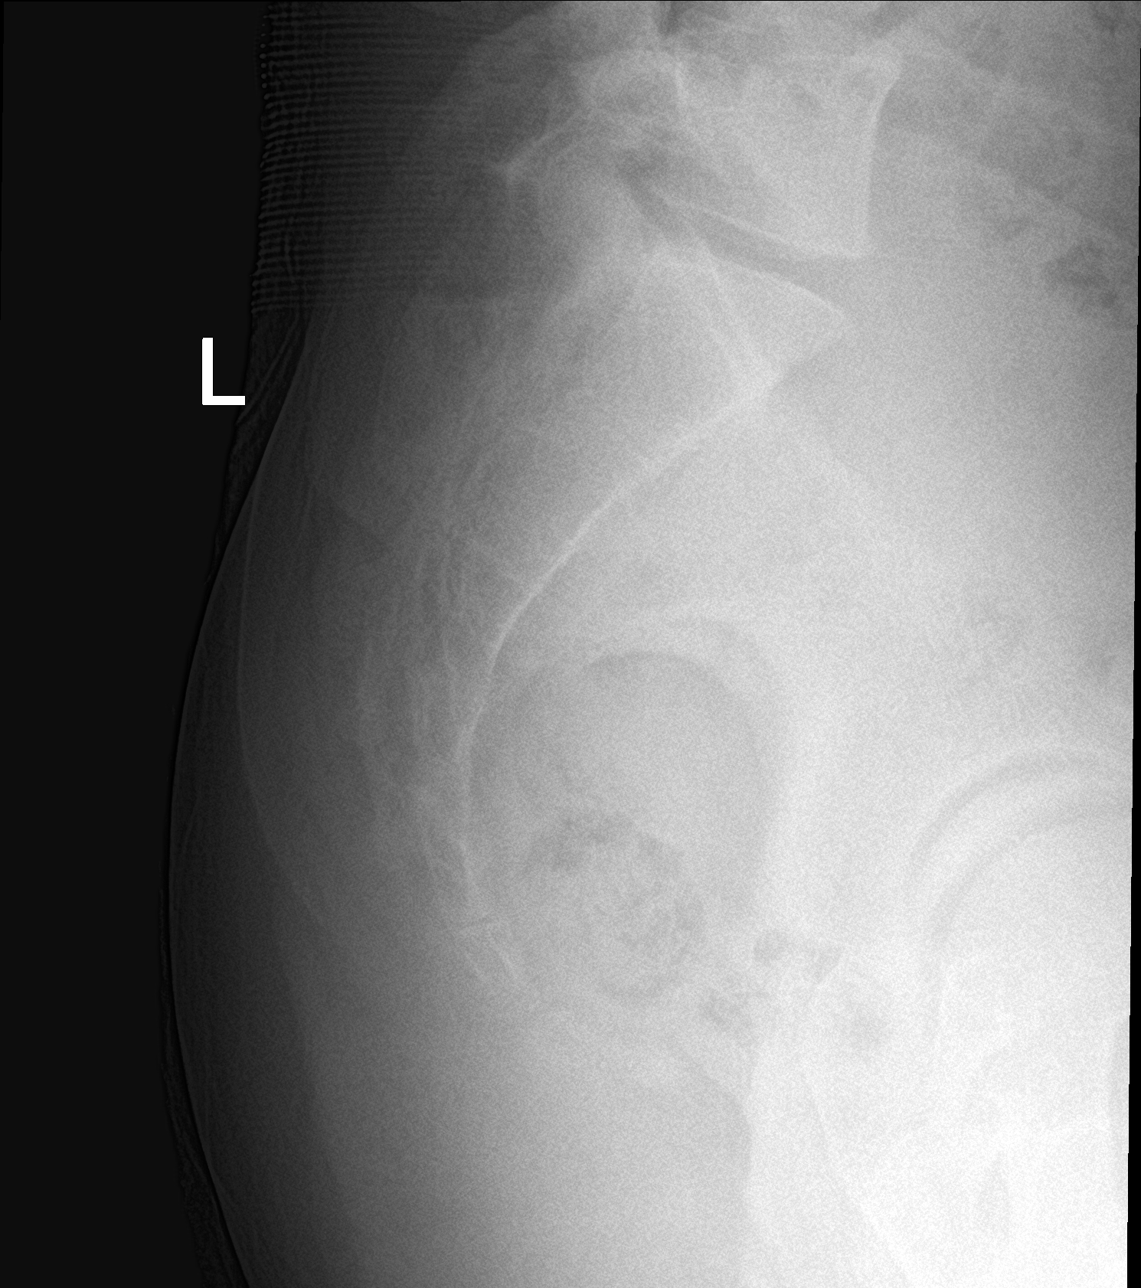

[3 of 3 positions shown; findings below may reference images not displayed]

FINDINGS: Normal radiography. No traumatic or other abnormal finding.
Sacroiliac joints appear normal.
IMPRESSION: Normal radiographs.

## 2023-09-22 ENCOUNTER — Ambulatory Visit: Payer: Medicaid Other | Admitting: Physical Medicine and Rehabilitation

## 2024-03-09 ENCOUNTER — Encounter (HOSPITAL_COMMUNITY): Payer: Self-pay

## 2024-03-09 ENCOUNTER — Ambulatory Visit (HOSPITAL_COMMUNITY)
Admission: EM | Admit: 2024-03-09 | Discharge: 2024-03-09 | Disposition: A | Discharge status: Home / Self Care | Attending: Emergency Medicine | Admitting: Emergency Medicine

## 2024-03-09 DIAGNOSIS — Z113 Encounter for screening for infections with a predominantly sexual mode of transmission: Secondary | ICD-10-CM | POA: Insufficient documentation

## 2024-03-09 LAB — HIV ANTIBODY (ROUTINE TESTING W REFLEX): HIV Screen 4th Generation wRfx: NONREACTIVE

## 2024-03-09 NOTE — ED Triage Notes (Signed)
 Patient states his girlfriend called him and told him that she had an STI, but did not specify. Patient denies any symptoms.  Patient is also requesting blood tests as well.
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
## 2024-03-09 NOTE — Discharge Instructions (Addendum)
 Today you have been screened for gonorrhea, chlamydia, trichomoniasis, HIV and syphilis.  Results will be available via MyChart and staff will contact if results are abnormal due to start the appropriate treatment.  Abstain from intercourse until results have been received.  Return to clinic for any new or urgent symptoms.

## 2024-03-10 ENCOUNTER — Ambulatory Visit (HOSPITAL_COMMUNITY): Payer: Self-pay

## 2024-03-10 LAB — CYTOLOGY, (ORAL, ANAL, URETHRAL) ANCILLARY ONLY
Chlamydia: POSITIVE — AB
Comment: NEGATIVE
Comment: NEGATIVE
Comment: NORMAL
Neisseria Gonorrhea: NEGATIVE
Trichomonas: NEGATIVE

## 2024-03-10 LAB — RPR: RPR Ser Ql: NONREACTIVE

## 2024-03-10 MED ORDER — DOXYCYCLINE HYCLATE 100 MG PO TABS: 100.0000 mg | ORAL_TABLET | Freq: Two times a day (BID) | ORAL | 0 refills | Status: AC

## 2024-06-12 ENCOUNTER — Encounter: Payer: Self-pay | Admitting: Radiology

## 2024-06-13 ENCOUNTER — Ambulatory Visit (HOSPITAL_COMMUNITY)
Admission: EM | Admit: 2024-06-13 | Discharge: 2024-06-13 | Disposition: A | Discharge status: Home / Self Care | Attending: Emergency Medicine | Admitting: Emergency Medicine

## 2024-06-13 ENCOUNTER — Encounter (HOSPITAL_COMMUNITY): Payer: Self-pay | Admitting: Emergency Medicine

## 2024-06-13 DIAGNOSIS — Z113 Encounter for screening for infections with a predominantly sexual mode of transmission: Secondary | ICD-10-CM | POA: Diagnosis not present

## 2024-06-13 LAB — HIV ANTIBODY (ROUTINE TESTING W REFLEX): HIV Screen 4th Generation wRfx: NONREACTIVE

## 2024-06-13 NOTE — Discharge Instructions (Signed)
We will call you if anything on your swab or blood work returns positive. You can also see these results on MyChart. Please abstain from sexual intercourse until your results return.

## 2024-06-13 NOTE — ED Triage Notes (Signed)
 Pt st's he just needs a STD check  St's he was told to recheck after he finished his Rx's from previous exposure.  Pt denies any symptoms
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
## 2024-06-14 LAB — CYTOLOGY, (ORAL, ANAL, URETHRAL) ANCILLARY ONLY
Chlamydia: NEGATIVE
Comment: NEGATIVE
Comment: NEGATIVE
Comment: NORMAL
Neisseria Gonorrhea: NEGATIVE
Trichomonas: NEGATIVE

## 2024-06-14 LAB — RPR: RPR Ser Ql: NONREACTIVE

## 2024-06-26 ENCOUNTER — Encounter: Admitting: Family Medicine
